# Patient Record
Sex: Female | Born: 1960 | Race: White | Hispanic: No | Marital: Married | Smoking: Former smoker
Health system: Southern US, Community
[De-identification: ages and names within clinical notes are randomized; demographics above are authoritative.]

## PROBLEM LIST (undated history)

## (undated) DIAGNOSIS — F419 Anxiety disorder, unspecified: Secondary | ICD-10-CM

## (undated) DIAGNOSIS — E531 Pyridoxine deficiency: Secondary | ICD-10-CM

## (undated) DIAGNOSIS — F329 Major depressive disorder, single episode, unspecified: Secondary | ICD-10-CM

## (undated) DIAGNOSIS — M1990 Unspecified osteoarthritis, unspecified site: Secondary | ICD-10-CM

## (undated) DIAGNOSIS — E669 Obesity, unspecified: Secondary | ICD-10-CM

## (undated) DIAGNOSIS — A4902 Methicillin resistant Staphylococcus aureus infection, unspecified site: Secondary | ICD-10-CM

## (undated) DIAGNOSIS — I509 Heart failure, unspecified: Secondary | ICD-10-CM

## (undated) DIAGNOSIS — J811 Chronic pulmonary edema: Secondary | ICD-10-CM

## (undated) DIAGNOSIS — M549 Dorsalgia, unspecified: Secondary | ICD-10-CM

## (undated) DIAGNOSIS — F32A Depression, unspecified: Secondary | ICD-10-CM

## (undated) DIAGNOSIS — I10 Essential (primary) hypertension: Secondary | ICD-10-CM

## (undated) DIAGNOSIS — J449 Chronic obstructive pulmonary disease, unspecified: Secondary | ICD-10-CM

## (undated) DIAGNOSIS — K219 Gastro-esophageal reflux disease without esophagitis: Secondary | ICD-10-CM

## (undated) DIAGNOSIS — M47816 Spondylosis without myelopathy or radiculopathy, lumbar region: Secondary | ICD-10-CM

## (undated) DIAGNOSIS — N189 Chronic kidney disease, unspecified: Secondary | ICD-10-CM

## (undated) DIAGNOSIS — Z1621 Resistance to vancomycin: Secondary | ICD-10-CM

## (undated) DIAGNOSIS — E559 Vitamin D deficiency, unspecified: Secondary | ICD-10-CM

## (undated) DIAGNOSIS — A491 Streptococcal infection, unspecified site: Secondary | ICD-10-CM

## (undated) HISTORY — PX: COLON SURGERY: SHX602

## (undated) HISTORY — PX: ABDOMINAL HYSTERECTOMY: SHX81

## (undated) HISTORY — PX: ABDOMINAL SURGERY: SHX537

---

## 2004-04-16 ENCOUNTER — Ambulatory Visit: Payer: Self-pay | Admitting: Family Medicine

## 2004-06-02 ENCOUNTER — Ambulatory Visit: Payer: Self-pay | Admitting: Family Medicine

## 2004-06-23 ENCOUNTER — Ambulatory Visit: Payer: Self-pay

## 2004-08-12 ENCOUNTER — Inpatient Hospital Stay: Payer: Self-pay | Admitting: Internal Medicine

## 2008-10-20 ENCOUNTER — Ambulatory Visit: Payer: Self-pay | Admitting: Internal Medicine

## 2014-04-04 ENCOUNTER — Ambulatory Visit: Payer: Self-pay | Admitting: Family Medicine

## 2016-01-24 ENCOUNTER — Ambulatory Visit: Payer: Medicare Other

## 2016-01-24 ENCOUNTER — Ambulatory Visit
Admission: EM | Admit: 2016-01-24 | Discharge: 2016-01-24 | Disposition: A | Discharge status: Home / Self Care | Payer: Medicare Other | Attending: Family Medicine | Admitting: Family Medicine

## 2016-01-24 ENCOUNTER — Encounter: Payer: Self-pay | Admitting: Emergency Medicine

## 2016-01-24 DIAGNOSIS — Z7901 Long term (current) use of anticoagulants: Secondary | ICD-10-CM | POA: Diagnosis not present

## 2016-01-24 DIAGNOSIS — Z944 Liver transplant status: Secondary | ICD-10-CM | POA: Diagnosis not present

## 2016-01-24 DIAGNOSIS — Z9482 Intestine transplant status: Secondary | ICD-10-CM | POA: Diagnosis not present

## 2016-01-24 DIAGNOSIS — E559 Vitamin D deficiency, unspecified: Secondary | ICD-10-CM | POA: Diagnosis not present

## 2016-01-24 DIAGNOSIS — Z7951 Long term (current) use of inhaled steroids: Secondary | ICD-10-CM | POA: Diagnosis not present

## 2016-01-24 DIAGNOSIS — Z7952 Long term (current) use of systemic steroids: Secondary | ICD-10-CM | POA: Diagnosis not present

## 2016-01-24 DIAGNOSIS — X58XXXA Exposure to other specified factors, initial encounter: Secondary | ICD-10-CM | POA: Insufficient documentation

## 2016-01-24 DIAGNOSIS — K219 Gastro-esophageal reflux disease without esophagitis: Secondary | ICD-10-CM | POA: Diagnosis not present

## 2016-01-24 DIAGNOSIS — S8391XA Sprain of unspecified site of right knee, initial encounter: Secondary | ICD-10-CM | POA: Insufficient documentation

## 2016-01-24 DIAGNOSIS — Z87891 Personal history of nicotine dependence: Secondary | ICD-10-CM | POA: Insufficient documentation

## 2016-01-24 DIAGNOSIS — Z9483 Pancreas transplant status: Secondary | ICD-10-CM | POA: Diagnosis not present

## 2016-01-24 DIAGNOSIS — I10 Essential (primary) hypertension: Secondary | ICD-10-CM | POA: Insufficient documentation

## 2016-01-24 DIAGNOSIS — J449 Chronic obstructive pulmonary disease, unspecified: Secondary | ICD-10-CM | POA: Diagnosis not present

## 2016-01-24 DIAGNOSIS — Z79899 Other long term (current) drug therapy: Secondary | ICD-10-CM | POA: Insufficient documentation

## 2016-01-24 DIAGNOSIS — M25561 Pain in right knee: Secondary | ICD-10-CM | POA: Diagnosis not present

## 2016-01-24 HISTORY — DX: Obesity, unspecified: E66.9

## 2016-01-24 HISTORY — DX: Chronic kidney disease, unspecified: N18.9

## 2016-01-24 HISTORY — DX: Vitamin D deficiency, unspecified: E55.9

## 2016-01-24 HISTORY — DX: Essential (primary) hypertension: I10

## 2016-01-24 HISTORY — DX: Pyridoxine deficiency: E53.1

## 2016-01-24 HISTORY — DX: Dorsalgia, unspecified: M54.9

## 2016-01-24 HISTORY — DX: Resistance to vancomycin: Z16.21

## 2016-01-24 HISTORY — DX: Streptococcal infection, unspecified site: A49.1

## 2016-01-24 HISTORY — DX: Gastro-esophageal reflux disease without esophagitis: K21.9

## 2016-01-24 HISTORY — DX: Methicillin resistant Staphylococcus aureus infection, unspecified site: A49.02

## 2016-01-24 HISTORY — DX: Chronic pulmonary edema: J81.1

## 2016-01-24 HISTORY — DX: Spondylosis without myelopathy or radiculopathy, lumbar region: M47.816

## 2016-01-24 HISTORY — DX: Unspecified osteoarthritis, unspecified site: M19.90

## 2016-01-24 HISTORY — DX: Heart failure, unspecified: I50.9

## 2016-01-24 HISTORY — DX: Chronic obstructive pulmonary disease, unspecified: J44.9

## 2016-01-24 HISTORY — DX: Depression, unspecified: F32.A

## 2016-01-24 HISTORY — DX: Anxiety disorder, unspecified: F41.9

## 2016-01-24 HISTORY — DX: Major depressive disorder, single episode, unspecified: F32.9

## 2016-01-24 NOTE — ED Triage Notes (Signed)
Patient c/o pain in her right knee after injuring it at home today.

## 2016-01-24 NOTE — ED Provider Notes (Signed)
[facility]    CSN: [id number] Arrival date & time: 01/24/16  1652     History   Chief Complaint Chief Complaint  Patient presents with  . Knee Pain    right    HPI Kaitlyn Collier is a 55 y.o. female.   Patient's here because of right knee pain. She states earlier today she stepped off a step up position and developed pain in her right knee. She thinks she twisted her right knee. She was worried and came in to be evaluated and treated and should be noted patient is a very unusual medical patient. 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 She is has chronic back pain and has had to have a nerve ablation done as well. She is unable take anti-inflammatories and she hasn't limited her mental Tylenol due to her liver transplant. She's also multiple medications and limitations far as what medicine she can take when she can't. Past family medical history is nothing significant or pertinent to today's visit. Size having the multiorgan transplant she's had a hysterectomy. She is a former smoker. She has long list of medicines which she cannot take.   The history is provided by the patient and the spouse. No language interpreter was used.  Knee Pain  Location:  Knee Injury: yes   Knee location:  R knee Pain details:    Quality:  Aching, pressure and shooting   Severity:  Moderate   Onset quality:  Sudden   Timing:  Constant   Progression:  Worsening Chronicity:  New Dislocation: no   Foreign body present:  No foreign bodies Relieved by:  Nothing Associated symptoms: back pain     Past Medical History:  Diagnosis Date  . Anxiety   . Back pain   . CHF (congestive heart failure) ([place])   . CKD (chronic kidney disease)   . COPD (chronic obstructive pulmonary disease) ([place])   . Depression   . DJD (degenerative joint disease), lumbar     . GERD (gastroesophageal reflux disease)   . Hypertension   . MRSA (methicillin resistant Staphylococcus aureus)   . Obesity   . Osteoarthritis   . Pulmonary edema   . Vitamin B6 deficiency   . Vitamin D deficiency   . VRE (vancomycin-resistant Enterococci)     There are no active problems to display for this patient.   Past Surgical History:  Procedure Laterality Date  . ABDOMINAL HYSTERECTOMY    . ABDOMINAL SURGERY    . COLON SURGERY      OB History    No data available       Home Medications    Prior to Admission medications   Medication Sig Start Date End Date Taking? Authorizing Provider  acetaminophen (TYLENOL) 325 MG tablet Take 650 mg by mouth 2 (two) times daily.   Yes Historical Provider, MD  albuterol (PROVENTIL HFA;VENTOLIN HFA) 108 (90 Base) MCG/ACT inhaler Inhale 2 puffs into the lungs every 6 (six) hours as needed for wheezing or shortness of breath.   Yes Historical Provider, MD  apixaban (ELIQUIS) 2.5 MG TABS tablet Take 2.5 mg by mouth 2 (two) times daily.   Yes Historical Provider, MD  budesonide (PULMICORT) 0.5 MG/2ML nebulizer solution Take 0.5 mg by nebulization 2 (two) times daily.   Yes Historical Provider, MD  buPROPion [facility] SR)  150 MG 12 hr tablet Take 300 mg by mouth daily.   Yes Historical Provider, MD  calcitRIOL (ROCALTROL) 0.25 MCG capsule Take 0.25 mcg by mouth daily.   Yes Historical Provider, MD  cetirizine (ZYRTEC) 10 MG tablet Take 10 mg by mouth daily.   Yes Historical Provider, MD  citalopram (CELEXA) 40 MG tablet Take 40 mg by mouth daily.   Yes Historical Provider, MD  cycloSPORINE modified (NEORAL) 25 MG capsule Take 75 mg by mouth 2 (two) times daily.   Yes Historical Provider, MD  diazepam (VALIUM) 5 MG tablet Take 5 mg by mouth at bedtime as needed for anxiety.   Yes Historical Provider, MD  fluticasone (FLONASE) 50 MCG/ACT nasal spray Place 1 spray into both nostrils daily.   Yes Historical Provider, MD  fluticasone  furoate-vilanterol (BREO ELLIPTA) 100-25 MCG/INH AEPB Inhale 1 puff into the lungs daily.   Yes Historical Provider, MD  HYDROmorphone (DILAUDID) 2 MG tablet Take 2 mg by mouth every 6 (six) hours as needed for severe pain.   Yes Historical Provider, MD  ipratropium-albuterol (DUONEB) 0.5-2.5 (3) MG/3ML SOLN Take 3 mLs by nebulization 3 (three) times daily.   Yes Historical Provider, MD  losartan (COZAAR) 50 MG tablet Take 50 mg by mouth daily.   Yes Historical Provider, MD  Multiple Vitamin (MULTIVITAMIN WITH MINERALS) TABS tablet Take 1 tablet by mouth daily.   Yes Historical Provider, MD  mycophenolate (CELLCEPT) 250 MG capsule Take 500 mg by mouth 2 (two) times daily.   Yes Historical Provider, MD  omeprazole (PRILOSEC OTC) 20 MG tablet Take 20 mg by mouth daily.   Yes Historical Provider, MD  predniSONE (DELTASONE) 5 MG tablet Take 5 mg by mouth daily with breakfast.   Yes Historical Provider, MD  SUMAtriptan (IMITREX) 25 MG tablet Take 50 mg by mouth every 2 (two) hours as needed for migraine. May repeat in 2 hours if headache persists or recurs.   Yes Historical Provider, MD  topiramate (TOPAMAX) 100 MG tablet Take 200 mg by mouth daily.   Yes Historical Provider, MD  verapamil (CALAN) 80 MG tablet Take 80 mg by mouth 2 (two) times daily.   Yes Historical Provider, MD    Family History History reviewed. No pertinent family history.  Social History Social History  Substance Use Topics  . Smoking status: Former Research scientist (life sciences)  . Smokeless tobacco: Never Used  . Alcohol use No     Allergies   Review of patient's allergies indicates not on file.   Review of Systems Review of Systems  Musculoskeletal: Positive for arthralgias, back pain, gait problem and myalgias.  All other systems reviewed and are negative.    Physical Exam Triage Vital Signs ED Triage Vitals  Enc Vitals Group     BP 01/24/16 1740 (!) 164/87     Pulse Rate 01/24/16 1740 80     Resp 01/24/16 1740 16     Temp  01/24/16 1740 98 F (36.7 C)     Temp Source 01/24/16 1740 Tympanic     SpO2 01/24/16 1740 98 %     Weight 01/24/16 1739 184 lb (83.5 kg)     Height 01/24/16 1739 5' 4.5" (1.638 m)     Head Circumference --      Peak Flow --      Pain Score 01/24/16 1742 9     Pain Loc --      Pain Edu? --      Excl. in [place]? --  No data found.   Updated Vital Signs BP (!) 164/87 (BP Location: Left Arm)   Pulse 80   Temp 98 F (36.7 C) (Tympanic)   Resp 16   Ht 5' 4.5" (1.638 m)   Wt 184 lb (83.5 kg)   SpO2 98%   BMI 31.10 kg/m   Visual Acuity Right Eye Distance:   Left Eye Distance:   Bilateral Distance:    Right Eye Near:   Left Eye Near:    Bilateral Near:     Physical Exam  Constitutional: She appears well-developed and well-nourished. No distress.  HENT:  Head: Normocephalic and atraumatic.  Eyes: Pupils are equal, round, and reactive to light.  Neck: Normal range of motion.  Pulmonary/Chest: Effort normal.  Musculoskeletal: She exhibits edema and tenderness. She exhibits no deformity.       Right knee: She exhibits swelling and bony tenderness. She exhibits no deformity, normal alignment, no LCL laxity, normal patellar mobility and no MCL laxity. Tenderness found.  Neurological: She is alert.  Skin: Skin is warm. She is not diaphoretic. No erythema.  Psychiatric: She has a normal mood and affect.  Vitals reviewed.    UC Treatments / Results  Labs (all labs ordered are listed, but only abnormal results are displayed) Labs Reviewed - No data to display  EKG  EKG Interpretation None       Radiology Dg Knee Complete 4 Views Right  Result Date: 01/24/2016 CLINICAL DATA:  Right knee anterior and lateral pain. EXAM: RIGHT KNEE - COMPLETE 4+ VIEW COMPARISON:  None. FINDINGS: No evidence of fracture, dislocation, or joint effusion. No evidence of focal bone abnormality. Mild osteoarthritic changes of the patellofemoral and lateral compartment and moderate  osteoarthritic changes of the medial compartment of the knee joint are seen. Soft tissues are unremarkable. IMPRESSION: No acute fracture or dislocation identified about the right Knee. Osteoarthritic changes, predominantly in the medial compartment of the right knee joint. Electronically Signed   By: Fidela Salisbury M.D.   On: 01/24/2016 18:50    Procedures Procedures (including critical care time)  Medications Ordered in UC Medications - No data to display   Initial Impression / Assessment and Plan / UC Course  I have reviewed the triage vital signs and the nursing notes.  Pertinent labs & imaging results that were available during my care of the patient were reviewed by me and considered in my medical decision making (see chart for details).  Clinical Course   Patient was informed that she has osteoarthritic changes in the right knee at this point time because she has limitations as far as pain medications and anti-inflammatories and also acetaminophen will allow her take her regular pain medication she has a home with follow-up with orthopedic as needed.  Final Clinical Impressions(s) / UC Diagnoses   Final diagnoses:  Acute pain of right knee  Sprain of right knee, unspecified ligament, initial encounter    New Prescriptions Discharge Medication List as of 01/24/2016  7:17 PM       Frederich Cha, MD 01/24/16 1955

## 2016-01-26 ENCOUNTER — Telehealth: Payer: Self-pay

## 2016-01-26 NOTE — Telephone Encounter (Signed)
Courtesy call back completed today after patients visit at [facility]. Patient improved and will follow up with their PCP if symptoms continue or worsen.   

## 2017-11-13 ENCOUNTER — Emergency Department: Payer: Medicare Other

## 2017-11-13 ENCOUNTER — Other Ambulatory Visit: Payer: Self-pay

## 2017-11-13 ENCOUNTER — Emergency Department
Admission: EM | Admit: 2017-11-13 | Discharge: 2017-11-13 | Disposition: A | Discharge status: Home / Self Care | Payer: Medicare Other | Attending: Emergency Medicine | Admitting: Emergency Medicine

## 2017-11-13 DIAGNOSIS — Z7901 Long term (current) use of anticoagulants: Secondary | ICD-10-CM | POA: Diagnosis not present

## 2017-11-13 DIAGNOSIS — Y999 Unspecified external cause status: Secondary | ICD-10-CM | POA: Insufficient documentation

## 2017-11-13 DIAGNOSIS — Z79899 Other long term (current) drug therapy: Secondary | ICD-10-CM | POA: Diagnosis not present

## 2017-11-13 DIAGNOSIS — Y92009 Unspecified place in unspecified non-institutional (private) residence as the place of occurrence of the external cause: Secondary | ICD-10-CM | POA: Diagnosis not present

## 2017-11-13 DIAGNOSIS — S0990XA Unspecified injury of head, initial encounter: Secondary | ICD-10-CM | POA: Diagnosis not present

## 2017-11-13 DIAGNOSIS — Y9301 Activity, walking, marching and hiking: Secondary | ICD-10-CM | POA: Insufficient documentation

## 2017-11-13 DIAGNOSIS — Z87891 Personal history of nicotine dependence: Secondary | ICD-10-CM | POA: Insufficient documentation

## 2017-11-13 DIAGNOSIS — I509 Heart failure, unspecified: Secondary | ICD-10-CM | POA: Insufficient documentation

## 2017-11-13 DIAGNOSIS — J449 Chronic obstructive pulmonary disease, unspecified: Secondary | ICD-10-CM | POA: Insufficient documentation

## 2017-11-13 DIAGNOSIS — W108XXA Fall (on) (from) other stairs and steps, initial encounter: Secondary | ICD-10-CM | POA: Insufficient documentation

## 2017-11-13 DIAGNOSIS — S52501A Unspecified fracture of the lower end of right radius, initial encounter for closed fracture: Secondary | ICD-10-CM | POA: Diagnosis not present

## 2017-11-13 DIAGNOSIS — I130 Hypertensive heart and chronic kidney disease with heart failure and stage 1 through stage 4 chronic kidney disease, or unspecified chronic kidney disease: Secondary | ICD-10-CM | POA: Insufficient documentation

## 2017-11-13 DIAGNOSIS — N189 Chronic kidney disease, unspecified: Secondary | ICD-10-CM | POA: Diagnosis not present

## 2017-11-13 DIAGNOSIS — S59911A Unspecified injury of right forearm, initial encounter: Secondary | ICD-10-CM | POA: Diagnosis present

## 2017-11-13 DIAGNOSIS — W19XXXA Unspecified fall, initial encounter: Secondary | ICD-10-CM

## 2017-11-13 LAB — CBC WITH DIFFERENTIAL/PLATELET
Basophils Absolute: 0 10*3/uL (ref 0–0.1)
Basophils Relative: 1 %
EOS PCT: 1 %
Eosinophils Absolute: 0 10*3/uL (ref 0–0.7)
HCT: 25 % — ABNORMAL LOW (ref 35.0–47.0)
HEMOGLOBIN: 8.1 g/dL — AB (ref 12.0–16.0)
LYMPHS ABS: 0.3 10*3/uL — AB (ref 1.0–3.6)
Lymphocytes Relative: 23 %
MCH: 29.5 pg (ref 26.0–34.0)
MCHC: 32.2 g/dL (ref 32.0–36.0)
MCV: 91.4 fL (ref 80.0–100.0)
MONOS PCT: 8 %
Monocytes Absolute: 0.1 10*3/uL — ABNORMAL LOW (ref 0.2–0.9)
NEUTROS PCT: 67 %
Neutro Abs: 0.9 10*3/uL — ABNORMAL LOW (ref 1.4–6.5)
Platelets: 128 10*3/uL — ABNORMAL LOW (ref 150–440)
RBC: 2.73 MIL/uL — AB (ref 3.80–5.20)
RDW: 16.3 % — ABNORMAL HIGH (ref 11.5–14.5)
WBC: 1.3 10*3/uL — AB (ref 3.6–11.0)

## 2017-11-13 LAB — COMPREHENSIVE METABOLIC PANEL
ALK PHOS: 137 U/L — AB (ref 38–126)
ALT: 15 U/L (ref 0–44)
AST: 29 U/L (ref 15–41)
Albumin: 3.3 g/dL — ABNORMAL LOW (ref 3.5–5.0)
Anion gap: 8 (ref 5–15)
BUN: 42 mg/dL — ABNORMAL HIGH (ref 6–20)
CALCIUM: 8.3 mg/dL — AB (ref 8.9–10.3)
CO2: 21 mmol/L — ABNORMAL LOW (ref 22–32)
CREATININE: 4.8 mg/dL — AB (ref 0.44–1.00)
Chloride: 114 mmol/L — ABNORMAL HIGH (ref 98–111)
GFR calc non Af Amer: 9 mL/min — ABNORMAL LOW (ref >60)
GFR, EST AFRICAN AMERICAN: 11 mL/min — AB (ref >60)
Glucose, Bld: 94 mg/dL (ref 70–99)
Potassium: 4.1 mmol/L (ref 3.5–5.1)
Sodium: 143 mmol/L (ref 135–145)
TOTAL PROTEIN: 6 g/dL — AB (ref 6.5–8.1)
Total Bilirubin: 1 mg/dL (ref 0.3–1.2)

## 2017-11-13 MED ORDER — ONDANSETRON 4 MG PO TBDP
4.0000 mg | ORAL_TABLET | Freq: Once | ORAL | Status: AC
  Administered 2017-11-13: 4 mg via ORAL
  Filled 2017-11-13: qty 1

## 2017-11-13 MED ORDER — BACITRACIN ZINC 500 UNIT/GM EX OINT
TOPICAL_OINTMENT | Freq: Once | CUTANEOUS | Status: AC
  Administered 2017-11-13: 1 via TOPICAL

## 2017-11-13 MED ORDER — HYDROMORPHONE HCL 1 MG/ML IJ SOLN
0.5000 mg | INTRAMUSCULAR | Status: AC
  Administered 2017-11-13: 0.5 mg via INTRAMUSCULAR

## 2017-11-13 MED ORDER — HYDROMORPHONE HCL 1 MG/ML IJ SOLN
INTRAMUSCULAR | Status: AC
  Administered 2017-11-13: 0.5 mg via INTRAMUSCULAR
  Filled 2017-11-13: qty 1

## 2017-11-13 MED ORDER — OXYCODONE-ACETAMINOPHEN 5-325 MG PO TABS
1.0000 | ORAL_TABLET | Freq: Once | ORAL | Status: AC
  Administered 2017-11-13: 1 via ORAL
  Filled 2017-11-13: qty 1

## 2017-11-13 MED ORDER — BACITRACIN ZINC 500 UNIT/GM EX OINT
TOPICAL_OINTMENT | CUTANEOUS | Status: AC
  Filled 2017-11-13: qty 0.9

## 2017-11-13 NOTE — ED Notes (Signed)
Dr. Cinda Quest is aware of WBC of 1.3.

## 2017-11-13 NOTE — ED Notes (Signed)
Xray called this RN stating that pt would not cooperate for xrays and xray requested pain medicine. Orders received from Dr. Jacqualine Code.

## 2017-11-13 NOTE — ED Notes (Signed)
Abrasion noted to R elbow, no deformity noted by this RN as previously expressed by EMS. Bleeding under control at this time.

## 2017-11-13 NOTE — ED Notes (Signed)
Wound care r arm with sterile dressing applied  Wrapped with kling patient tolerated procedure well

## 2017-11-13 NOTE — ED Provider Notes (Addendum)
Went to evaluate patient, she is currently having imaging studies performed including x-rays and CT the head and is not presently in the room.   Delman Kitten, MD 11/13/17 1014    Delman Kitten, MD 11/13/17 1017

## 2017-11-13 NOTE — ED Provider Notes (Addendum)
[facility] Emergency Department Provider Note   ____________________________________________   First MD Initiated Contact with Patient 11/13/17 1111     (approximate)  I have reviewed the triage vital signs and the nursing notes.   HISTORY  Chief Complaint Fall and Arm Injury    HPI Kaitlyn Collier is a 57 y.o. female patient reports she was letting the dogs out of the house stepped out further than usual and overbalanced and fell.  She does not remember exactly what happens but she is pretty sure that is what happened.  When I suggest that she may have passed out first and then fall and she says "I do not pass out".  Patient complains of pain in her wrist.  There is deformity there.  There is some bruising on the right forearm some small abrasions around the elbow bruising on the right part of the forehead and bruising on the left palm.  There is no pain in the left hand she is using it well.  Patient has a history of transplant liver small bowel is taking multiple antirejection drugs is supposed to take them at 9 AM. Past Medical History:  Diagnosis Date  . Anxiety   . Back pain   . CHF (congestive heart failure) ([place])   . CKD (chronic kidney disease)   . COPD (chronic obstructive pulmonary disease) ([place])   . Depression   . DJD (degenerative joint disease), lumbar   . GERD (gastroesophageal reflux disease)   . Hypertension   . MRSA (methicillin resistant Staphylococcus aureus)   . Obesity   . Osteoarthritis   . Pulmonary edema   . Vitamin B6 deficiency   . Vitamin D deficiency   . VRE (vancomycin-resistant Enterococci)     There are no active problems to display for this patient.   Past Surgical History:  Procedure Laterality Date  . ABDOMINAL HYSTERECTOMY    . ABDOMINAL SURGERY    . COLON SURGERY      Prior to Admission medications   Medication Sig Start Date End Date Taking? Authorizing Provider  acetaminophen (TYLENOL) 325 MG tablet  Take 650 mg by mouth 2 (two) times daily.    [provider]  albuterol (PROVENTIL HFA;VENTOLIN HFA) 108 (90 Base) MCG/ACT inhaler Inhale 2 puffs into the lungs every 6 (six) hours as needed for wheezing or shortness of breath.    [provider]  apixaban (ELIQUIS) 2.5 MG TABS tablet Take 2.5 mg by mouth 2 (two) times daily.    [provider]  budesonide (PULMICORT) 0.5 MG/2ML nebulizer solution Take 0.5 mg by nebulization 2 (two) times daily.    [provider]  buPROPion (WELLBUTRIN SR) 150 MG 12 hr tablet Take 300 mg by mouth daily.    [provider]  calcitRIOL (ROCALTROL) 0.25 MCG capsule Take 0.25 mcg by mouth daily.    [provider]  cetirizine (ZYRTEC) 10 MG tablet Take 10 mg by mouth daily.    [provider]  citalopram (CELEXA) 40 MG tablet Take 40 mg by mouth daily.    [provider]  cycloSPORINE modified (NEORAL) 25 MG capsule Take 75 mg by mouth 2 (two) times daily.    [provider]  diazepam (VALIUM) 5 MG tablet Take 5 mg by mouth at bedtime as needed for anxiety.    [provider]  fluticasone (FLONASE) 50 MCG/ACT nasal spray Place 1 spray into both nostrils daily.    [provider]  fluticasone  furoate-vilanterol (BREO ELLIPTA) 100-25 MCG/INH AEPB Inhale 1 puff into the lungs daily.    [provider]  HYDROmorphone (DILAUDID) 2 MG tablet Take 2 mg by mouth every 6 (six) hours as needed for severe pain.    [provider]  ipratropium-albuterol (DUONEB) 0.5-2.5 (3) MG/3ML SOLN Take 3 mLs by nebulization 3 (three) times daily.    [provider]  losartan (COZAAR) 50 MG tablet Take 50 mg by mouth daily.    [provider]  Multiple Vitamin (MULTIVITAMIN WITH MINERALS) TABS tablet Take 1 tablet by mouth daily.    [provider]  mycophenolate (CELLCEPT) 250 MG capsule Take 500 mg by mouth 2 (two) times daily.    [provider]  omeprazole (PRILOSEC OTC) 20 MG tablet Take 20 mg by mouth daily.    [provider]  predniSONE (DELTASONE) 5 MG tablet Take 5 mg by mouth daily with breakfast.    [provider]  SUMAtriptan (IMITREX) 25 MG tablet Take 50 mg by mouth every 2 (two) hours as needed for migraine. May repeat in 2 hours if headache persists or recurs.    [provider]  topiramate (TOPAMAX) 100 MG tablet Take 200 mg by mouth daily.    [provider]  verapamil (CALAN) 80 MG tablet Take 80 mg by mouth 2 (two) times daily.    [provider]    Allergies Codeine; Flagyl [metronidazole]; Phenergan [promethazine hcl]; Sulfa antibiotics; and Zosyn [piperacillin sod-tazobactam so]  History reviewed. No pertinent family history.  Social History Social History   Tobacco Use  . Smoking status: Former Research scientist (life sciences)  . Smokeless tobacco: Never Used  Substance Use Topics  . Alcohol use: No  . Drug use: No    Review of Systems  Constitutional: No fever/chills Eyes: No visual changes. ENT: No sore throat. Cardiovascular: Denies chest pain. Respiratory: Denies shortness of breath. Gastrointestinal: No abdominal pain.  No nausea, no vomiting.  No diarrhea.  No constipation. Genitourinary: Negative for dysuria. Musculoskeletal: Negative for back pain. Skin: Negative for rash. Neurological: Negative for headaches, focal weakness    ____________________________________________   PHYSICAL EXAM:  VITAL SIGNS: ED Triage Vitals  Enc Vitals Group     BP 11/13/17 0956 (!) 173/125     Pulse Rate 11/13/17 0954 70     Resp 11/13/17 0954 20     Temp 11/13/17 0954 98.7 F (37.1 C)     Temp Source 11/13/17 0954 Oral     SpO2 11/13/17 0954 93 %     Weight 11/13/17 0950 185 lb (83.9 kg)     Height 11/13/17 0950 5\' 4"  (1.626 m)     Head Circumference --      Peak Flow --      Pain Score 11/13/17 0950 10     Pain Loc --      Pain Edu? --      Excl. in [place]?  --     Constitutional: Alert and oriented. Well appearing and in no acute distress. Eyes: Conjunctivae are normal. PER. EOMI. Head: Atraumatic except for large bruise on the right side of the forehead. Nose: No congestion/rhinnorhea. Mouth/Throat: Mucous membranes are moist.  Oropharynx non-erythematous. Neck: No stridor.  No cervical spine tenderness to palpation. Cardiovascular: Normal rate, regular rhythm. Grossly normal heart sounds.  Good peripheral circulation. Respiratory: Normal respiratory effort.  No retractions. Lungs CTAB. Gastrointestinal: Soft and nontender. No distention. No abdominal bruits. No CVA tenderness. Musculoskeletal: No lower extremity tenderness.  Patient complains of swelling in the right leg.  When I measure both legs.  The right leg is actually smaller round than the left.  Both have slight trace edema. Neurologic:  Normal speech and language. No gross focal neurologic deficits are appreciated. Skin:  Skin is warm, dry and intact. No rash noted. Psychiatric: Mood and affect are normal. Speech and behavior are normal.  ____________________________________________   LABS (all labs ordered are listed, but only abnormal results are displayed)  Labs Reviewed  CBC WITH DIFFERENTIAL/PLATELET - Abnormal; Notable for the following components:      Result Value   WBC 1.3 (*)    RBC 2.73 (*)    Hemoglobin 8.1 (*)    HCT 25.0 (*)    RDW 16.3 (*)    Platelets 128 (*)    Neutro Abs 0.9 (*)    Lymphs Abs 0.3 (*)    Monocytes Absolute 0.1 (*)    All other components within normal limits  COMPREHENSIVE METABOLIC PANEL - Abnormal; Notable for the following components:   Chloride 114 (*)    CO2 21 (*)    BUN 42 (*)    Creatinine, Ser 4.80 (*)    Calcium 8.3 (*)    Total Protein 6.0 (*)    Albumin 3.3 (*)    Alkaline Phosphatase 137 (*)    GFR calc non Af Amer 9 (*)    GFR calc Af Amer 11 (*)    All other components within normal limits    ____________________________________________  EKG  EKG read interpreted by me shows A. fib at rate of 59 left axis no acute ST-T wave changes patient does have a history of A. fib ____________________________________________  RADIOLOGY  ED MD interpretation: X-rays read by radiology reviewed by me show an impacted fracture of the distal radius and ulna.  Head CT is read as negative.  Chest x-ray read by radiology reviewed by me as no acute disease  Official radiology report(s): Dg Chest 2 View  Result Date: 11/13/2017 CLINICAL DATA:  Fall downstairs with chest pain, initial encounter EXAM: CHEST - 2 VIEW COMPARISON:  04/04/2014 FINDINGS: Cardiac shadow is mildly enlarged but stable. Aortic calcifications are noted. Postsurgical changes are again seen in the right mid lung. No acute bony abnormality is noted. No focal infiltrate is seen. The lungs are hyperinflated. IMPRESSION: No acute abnormality noted. Electronically Signed   By: Inez Catalina M.D.   On: 11/13/2017 12:33   Dg Elbow Complete Right  Result Date: 11/13/2017 CLINICAL DATA:  Recent fall with elbow pain, initial encounter EXAM: RIGHT ELBOW - COMPLETE 3+ VIEW COMPARISON:  None. FINDINGS: There is no evidence of fracture, dislocation, or joint effusion. There is no evidence of arthropathy or other focal bone abnormality. Soft tissues are unremarkable. IMPRESSION: No acute abnormality noted. Electronically Signed   By: Inez Catalina M.D.   On: 11/13/2017 10:46   Ct Head Wo Contrast  Result Date: 11/13/2017 CLINICAL DATA:  Recent fall EXAM: CT HEAD WITHOUT CONTRAST TECHNIQUE: Contiguous axial images were obtained from the base of the skull through the vertex without intravenous contrast. COMPARISON:  None. FINDINGS: Brain: Mild atrophic changes are identified. No findings to suggest acute hemorrhage, acute infarction or space-occupying mass lesion are noted. Vascular: No hyperdense vessel or unexpected calcification. Skull: Normal.  Negative for fracture or focal lesion. Sinuses/Orbits: No acute finding. Other: Scalp hematoma is noted in the right frontal region consistent with the recent injury. IMPRESSION: Atrophic changes and scalp hematoma on  the right. No other acute abnormality is noted. Electronically Signed   By: Inez Catalina M.D.   On: 11/13/2017 10:36   Dg Hand Complete Right  Result Date: 11/13/2017 CLINICAL DATA:  Recent fall with hand pain, initial encounter EXAM: RIGHT HAND - COMPLETE 3+ VIEW COMPARISON:  None. FINDINGS: There are mildly impacted fractures of the distal radial and ulnar metastases. No significant displacement is noted aside from the impaction. No other fractures are seen. IMPRESSION: Distal radial and ulnar fractures with mild impaction. Electronically Signed   By: Inez Catalina M.D.   On: 11/13/2017 10:46    ____________________________________________   PROCEDURES  Procedure(s) performed:   Procedures  Critical Care performed:   ____________________________________________   INITIAL IMPRESSION / ASSESSMENT AND PLAN / ED COURSE  I have contacted orthopedics he is reviewing the films.  We will plan on evaluating the patient for possible syncope.  Stressing the abrasions and putting the patient in a splint wants orthopedics confirms that is what they want.   Orthopedics calls back we will splint the patient he will follow him up in clinic we use a long-arm splint to try to keep the splint off of the abrasions instead of the sugar tong as I was planning on.   ____________________________________________   FINAL CLINICAL IMPRESSION(S) / ED DIAGNOSES  Final diagnoses:  Fall, initial encounter  Closed fracture of distal end of right radius, unspecified fracture morphology, initial encounter     ED Discharge Orders    None       Note:  This document was prepared using Dragon voice recognition software and may include unintentional dictation errors.    Nena Polio,  MD 11/13/17 1251    Nena Polio, MD 11/13/17 [phone]

## 2017-11-13 NOTE — ED Notes (Signed)
X-ray at bedside

## 2017-11-13 NOTE — ED Notes (Signed)
Splint applied by ED tech Wilfred Lacy.

## 2017-11-13 NOTE — ED Triage Notes (Addendum)
Pt arrives [facility] for a fall. R arm arrives splinted. Fell down 3 stairs at home, landed on concrete. Deformity to R elbow, hematoma to R head. Does take blood thinners (eliquis). VSS. Hx COPD. No LOC. No dizziness. Just tripped.    Bruising noted all over body.

## 2017-11-13 NOTE — ED Notes (Signed)
Pt in CT at this time.

## 2017-11-13 NOTE — ED Notes (Signed)
Patient c/o nausea prior to going to x-ray. Patient requested and was given crackers.

## 2017-11-13 NOTE — Discharge Instructions (Addendum)
Keep the arm elevated as much as you can.  You can put ice on for 20 minutes every hour or so if you can tolerate it.  Be careful to keep a towel between your arm and the ice.  Wear the splint use a sling call orthopedics to arrange follow-up this coming week.  Please return for increasing pain numbness or blueness of paleness of the hand or any other new symptoms.  Use your pain medicine that you have at home.  Have the abrasions checked either at your doctor's office or here in 2 or 3 days unless you have already seen the orthopedic surgeon.

## 2017-11-13 NOTE — ED Notes (Signed)
Pt refusing to answer questions about medical hx, keeps repeating "my husband knows everything." will not answer allergies or what blood thinner pt is taking.   No sticks on L arm d/t "being prepared for dialysis."

## 2017-12-13 MED ORDER — MELATONIN 3 MG PO TABS
3.00 | ORAL_TABLET | ORAL | Status: DC
Start: 2017-12-13 — End: 2017-12-13

## 2017-12-13 MED ORDER — ACETAMINOPHEN 325 MG PO TABS
650.00 | ORAL_TABLET | ORAL | Status: DC
Start: ? — End: 2017-12-13

## 2017-12-13 MED ORDER — BUPROPION HCL ER (SR) 150 MG PO TB12
150.00 | ORAL_TABLET | ORAL | Status: DC
Start: 2017-12-14 — End: 2017-12-13

## 2017-12-13 MED ORDER — HEPARIN SODIUM (PORCINE) 1000 UNIT/ML IJ SOLN
INTRAMUSCULAR | Status: DC
Start: ? — End: 2017-12-13

## 2017-12-13 MED ORDER — PANTOPRAZOLE SODIUM 40 MG PO TBEC
40.00 | DELAYED_RELEASE_TABLET | ORAL | Status: DC
Start: 2017-12-14 — End: 2017-12-13

## 2017-12-13 MED ORDER — CITALOPRAM HYDROBROMIDE 20 MG PO TABS
40.00 | ORAL_TABLET | ORAL | Status: DC
Start: 2017-12-14 — End: 2017-12-13

## 2017-12-13 MED ORDER — GENERIC EXTERNAL MEDICATION
1.00 | Status: DC
Start: ? — End: 2017-12-13

## 2017-12-13 MED ORDER — PREDNISONE 5 MG PO TABS
5.00 | ORAL_TABLET | ORAL | Status: DC
Start: 2017-12-14 — End: 2017-12-13

## 2017-12-13 MED ORDER — TOPIRAMATE 100 MG PO TABS
100.00 | ORAL_TABLET | ORAL | Status: DC
Start: 2017-12-13 — End: 2017-12-13

## 2017-12-13 MED ORDER — APIXABAN 2.5 MG PO TABS
2.50 | ORAL_TABLET | ORAL | Status: DC
Start: 2017-12-13 — End: 2017-12-13

## 2017-12-13 MED ORDER — GENERIC EXTERNAL MEDICATION
12.50 | Status: DC
Start: 2017-12-13 — End: 2017-12-13

## 2017-12-13 MED ORDER — MYCOPHENOLATE MOFETIL 250 MG PO CAPS
500.00 | ORAL_CAPSULE | ORAL | Status: DC
Start: 2017-12-13 — End: 2017-12-13

## 2017-12-13 MED ORDER — CYCLOSPORINE MODIFIED (NEORAL) 25 MG PO CAPS
50.00 | ORAL_CAPSULE | ORAL | Status: DC
Start: 2017-12-13 — End: 2017-12-13

## 2017-12-13 MED ORDER — EPOETIN ALFA 4000 UNIT/ML IJ SOLN
4000.00 | INTRAMUSCULAR | Status: DC
Start: ? — End: 2017-12-13

## 2017-12-13 MED ORDER — VERAPAMIL HCL 80 MG PO TABS
80.00 | ORAL_TABLET | ORAL | Status: DC
Start: 2017-12-13 — End: 2017-12-13

## 2018-09-09 ENCOUNTER — Emergency Department
Admission: EM | Admit: 2018-09-09 | Discharge: 2018-09-09 | Disposition: A | Discharge status: Home / Self Care | Payer: Medicare Other | Attending: Emergency Medicine | Admitting: Emergency Medicine

## 2018-09-09 ENCOUNTER — Other Ambulatory Visit: Payer: Self-pay

## 2018-09-09 ENCOUNTER — Encounter: Payer: Self-pay | Admitting: Intensive Care

## 2018-09-09 DIAGNOSIS — J449 Chronic obstructive pulmonary disease, unspecified: Secondary | ICD-10-CM | POA: Diagnosis not present

## 2018-09-09 DIAGNOSIS — T82898A Other specified complication of vascular prosthetic devices, implants and grafts, initial encounter: Secondary | ICD-10-CM | POA: Diagnosis not present

## 2018-09-09 DIAGNOSIS — I130 Hypertensive heart and chronic kidney disease with heart failure and stage 1 through stage 4 chronic kidney disease, or unspecified chronic kidney disease: Secondary | ICD-10-CM | POA: Insufficient documentation

## 2018-09-09 DIAGNOSIS — Z7901 Long term (current) use of anticoagulants: Secondary | ICD-10-CM | POA: Insufficient documentation

## 2018-09-09 DIAGNOSIS — R58 Hemorrhage, not elsewhere classified: Secondary | ICD-10-CM

## 2018-09-09 DIAGNOSIS — Z87891 Personal history of nicotine dependence: Secondary | ICD-10-CM | POA: Insufficient documentation

## 2018-09-09 DIAGNOSIS — I509 Heart failure, unspecified: Secondary | ICD-10-CM | POA: Insufficient documentation

## 2018-09-09 DIAGNOSIS — N189 Chronic kidney disease, unspecified: Secondary | ICD-10-CM | POA: Diagnosis not present

## 2018-09-09 DIAGNOSIS — Z992 Dependence on renal dialysis: Secondary | ICD-10-CM | POA: Insufficient documentation

## 2018-09-09 DIAGNOSIS — Z79899 Other long term (current) drug therapy: Secondary | ICD-10-CM | POA: Diagnosis not present

## 2018-09-09 DIAGNOSIS — Y828 Other medical devices associated with adverse incidents: Secondary | ICD-10-CM | POA: Diagnosis not present

## 2018-09-09 LAB — CBC
HCT: 28.4 % — ABNORMAL LOW (ref 36.0–46.0)
Hemoglobin: 8.8 g/dL — ABNORMAL LOW (ref 12.0–15.0)
MCH: 30.7 pg (ref 26.0–34.0)
MCHC: 31 g/dL (ref 30.0–36.0)
MCV: 99 fL (ref 80.0–100.0)
Platelets: 91 10*3/uL — ABNORMAL LOW (ref 150–400)
RBC: 2.87 MIL/uL — ABNORMAL LOW (ref 3.87–5.11)
RDW: 15.3 % (ref 11.5–15.5)
WBC: 1.5 10*3/uL — ABNORMAL LOW (ref 4.0–10.5)
nRBC: 0 % (ref 0.0–0.2)

## 2018-09-09 LAB — PROTIME-INR
INR: 1.2 (ref 0.8–1.2)
Prothrombin Time: 15.1 seconds (ref 11.4–15.2)

## 2018-09-09 MED ORDER — LIDOCAINE-PRILOCAINE 2.5-2.5 % EX CREA
TOPICAL_CREAM | Freq: Once | CUTANEOUS | Status: AC
  Administered 2018-09-09: 18:00:00 via TOPICAL
  Filled 2018-09-09: qty 5

## 2018-09-09 NOTE — Discharge Instructions (Addendum)
Please follow-up with primary care provider, walk-in clinic in 1 week for suture removal.  Please follow-up with PCP to discuss trending low platelets.  Please return to the emergency department for any worsening symptoms or urgent changes in health such as any increasing pain swelling or bleeding.

## 2018-09-09 NOTE — ED Notes (Signed)
Pt given sandwich tray 

## 2018-09-09 NOTE — ED Triage Notes (Signed)
Patient is here because her access would not stop bleeding after dialysis today. Access is on Left arm and wrapped up with no bleeding through dressing noted at this time. A& ox4

## 2018-09-09 NOTE — ED Notes (Signed)
Discharge instructions reviewed with patient. Questions fielded by this RN. Patient verbalizes understanding of instructions. Patient discharged home in stable condition per gaines. No acute distress noted at time of discharge.   No peripheral IV placed this visit.   RN with pt ambulatory to lobby, sig other waiting in parking lot

## 2018-09-09 NOTE — ED Notes (Signed)
Pt finished dialysis a little before 300 pm today per report. Has had trouble stopping bleeding from dialysis access to LUE.  C[place]an unwrapped and not bleeding through gauze that can see.  Did not remove gauze/bandaid over access, will wait for MD to remove.

## 2018-09-09 NOTE — ED Provider Notes (Signed)
[facility] EMERGENCY DEPARTMENT Provider Note   CSN: [id number] Arrival date & time: 09/09/18  1533    History   Chief Complaint Chief Complaint  Patient presents with  . Vascular Access Problem    left arm    HPI Kaitlyn Collier is a 58 y.o. female presents to the emergency department for evaluation of bleeding along the left AC fossa along her vascular access for dialysis.  Patient states that her site of dialysis from 2 days ago has continued to ooze and bleed.  She was sent here by dialysis center for evaluation.  She denies any swelling, numbness, tingling.  Patient currently on Eliquis.  No history of bleeding problems in the past.  She is not having any pain along the elbow.     HPI  Past Medical History:  Diagnosis Date  . Anxiety   . Back pain   . CHF (congestive heart failure) ([place])   . CKD (chronic kidney disease)   . COPD (chronic obstructive pulmonary disease) ([place])   . Depression   . DJD (degenerative joint disease), lumbar   . GERD (gastroesophageal reflux disease)   . Hypertension   . MRSA (methicillin resistant Staphylococcus aureus)   . Obesity   . Osteoarthritis   . Pulmonary edema   . Vitamin B6 deficiency   . Vitamin D deficiency   . VRE (vancomycin-resistant Enterococci)     There are no active problems to display for this patient.   Past Surgical History:  Procedure Laterality Date  . ABDOMINAL HYSTERECTOMY    . ABDOMINAL SURGERY    . COLON SURGERY       OB History   No obstetric history on file.      Home Medications    Prior to Admission medications   Medication Sig Start Date End Date Taking? Authorizing Provider  acetaminophen (TYLENOL) 325 MG tablet Take 650 mg by mouth 2 (two) times daily.    [provider]  albuterol (PROVENTIL HFA;VENTOLIN HFA) 108 (90 Base) MCG/ACT inhaler Inhale 2 puffs into the lungs every 6 (six) hours as needed for wheezing or shortness of breath.    [provider]  apixaban (ELIQUIS) 2.5 MG TABS tablet Take 2.5 mg by mouth 2 (two) times daily.    [provider]  budesonide (PULMICORT) 0.5 MG/2ML nebulizer solution Take 0.5 mg by nebulization 2 (two) times daily.    [provider]  buPROPion (WELLBUTRIN SR) 150 MG 12 hr tablet Take 300 mg by mouth daily.    [provider]  calcitRIOL (ROCALTROL) 0.25 MCG capsule Take 0.25 mcg by mouth daily.    [provider]  cetirizine (ZYRTEC) 10 MG tablet Take 10 mg by mouth daily.    [provider]  citalopram (CELEXA) 40 MG tablet Take 40 mg by mouth daily.    [provider]  cycloSPORINE modified (NEORAL) 25 MG capsule Take 75 mg by mouth 2 (two) times daily.    [provider]  diazepam (VALIUM) 5 MG tablet Take 5 mg by mouth at bedtime as needed for anxiety.    [provider]  fluticasone (FLONASE) 50 MCG/ACT nasal spray Place 1 spray into both nostrils daily.    [provider]  fluticasone furoate-vilanterol (BREO ELLIPTA) 100-25 MCG/INH AEPB Inhale 1 puff into the lungs daily.    [provider]  HYDROmorphone (DILAUDID) 2 MG tablet Take 2 mg by mouth every 6 (six) hours as needed for severe pain.  [provider]  ipratropium-albuterol (DUONEB) 0.5-2.5 (3) MG/3ML SOLN Take 3 mLs by nebulization 3 (three) times daily.    [provider]  losartan (COZAAR) 50 MG tablet Take 50 mg by mouth daily.    [provider]  Multiple Vitamin (MULTIVITAMIN WITH MINERALS) TABS tablet Take 1 tablet by mouth daily.    [provider]  mycophenolate (CELLCEPT) 250 MG capsule Take 500 mg by mouth 2 (two) times daily.    [provider]  omeprazole (PRILOSEC OTC) 20 MG tablet Take 20 mg by mouth daily.    [provider]  predniSONE (DELTASONE) 5 MG tablet Take 5 mg by mouth daily with breakfast.    [provider]  SUMAtriptan (IMITREX) 25 MG tablet Take 50 mg by mouth  every 2 (two) hours as needed for migraine. May repeat in 2 hours if headache persists or recurs.    [provider]  topiramate (TOPAMAX) 100 MG tablet Take 200 mg by mouth daily.    [provider]  verapamil (CALAN) 80 MG tablet Take 80 mg by mouth 2 (two) times daily.    [provider]    Family History History reviewed. No pertinent family history.  Social History Social History   Tobacco Use  . Smoking status: Former Research scientist (life sciences)  . Smokeless tobacco: Never Used  Substance Use Topics  . Alcohol use: No  . Drug use: Yes    Comment: prescribed dilaudid     Allergies   Codeine; Flagyl [metronidazole]; Phenergan [promethazine hcl]; Sulfa antibiotics; and Zosyn [piperacillin sod-tazobactam so]   Review of Systems Review of Systems  Constitutional: Negative for fever.  Gastrointestinal: Negative for nausea and vomiting.  Musculoskeletal: Negative for arthralgias, back pain, joint swelling and myalgias.  Skin: Positive for wound.  Neurological: Negative for numbness.     Physical Exam Updated Vital Signs BP 124/79   Pulse 93   Temp 98.5 F (36.9 C) (Oral)   Resp 16   Ht 5\' 3"  (1.6 m)   Wt 65.8 kg   SpO2 97%   BMI 25.69 kg/m   Physical Exam Constitutional:      Appearance: Normal appearance. She is well-developed and normal weight.  HENT:     Head: Normocephalic and atraumatic.  Eyes:     Conjunctiva/sclera: Conjunctivae normal.  Neck:     Musculoskeletal: Normal range of motion.  Cardiovascular:     Rate and Rhythm: Normal rate.  Pulmonary:     Effort: Pulmonary effort is normal. No respiratory distress.  Musculoskeletal: Normal range of motion.     Comments: Left upper extremity with fistula present fistula appears to be working with palpable vibrations.  Access site from Wednesday appears to be continuing to ooze.  Bleeding is mild.  Skin:    General: Skin is warm.     Findings: No rash.  Neurological:     Mental Status: She is  alert and oriented to person, place, and time.  Psychiatric:        Behavior: Behavior normal.        Thought Content: Thought content normal.      ED Treatments / Results  Labs (all labs ordered are listed, but only abnormal results are displayed) Labs Reviewed  CBC - Abnormal; Notable for the following components:      Result Value   WBC 1.5 (*)    RBC 2.87 (*)    Hemoglobin 8.8 (*)    HCT 28.4 (*)    Platelets 91 (*)  All other components within normal limits  PROTIME-INR    EKG None  Radiology No results found.  Procedures .Marland KitchenLaceration Repair Date/Time: 09/09/2018 6:58 PM Performed by: Duanne Guess, PA-C Authorized by: Duanne Guess, PA-C   Consent:    Consent obtained:  Verbal   Consent given by:  Patient   Alternatives discussed:  No treatment Anesthesia (see MAR for exact dosages):    Anesthesia method:  Topical application   Topical anesthetic:  EMLA cream Laceration details:    Location:  Shoulder/arm   Shoulder/arm location:  L lower arm   Length (cm):  0.5   Depth (mm):  2 Repair type:    Repair type:  Simple Pre-procedure details:    Preparation:  Patient was prepped and draped in usual sterile fashion Treatment:    Area cleansed with:  Betadine   Amount of cleaning:  Standard Skin repair:    Repair method:  Sutures   Suture size:  5-0   Suture material:  Nylon   Number of sutures:  1 Post-procedure details:    Dressing:  Non-adherent dressing   Patient tolerance of procedure:  Tolerated well, no immediate complications   (including critical care time)  Medications Ordered in ED Medications  lidocaine-prilocaine (EMLA) cream ( Topical Given 09/09/18 1807)     Initial Impression / Assessment and Plan / ED Course  I have reviewed the triage vital signs and the nursing notes.  Pertinent labs & imaging results that were available during my care of the patient were reviewed by me and considered in my medical decision making (see  chart for details).        58 year old female with bleeding from her dialysis site from dialysis 2 days ago.  Site has been oozing blood.  She was sent by dialysis for evaluation.  Patient noted to have trending low platelets.  INR and PTT normal.  Patient given 1 stitch along the bleeding site which resolved the bleeding.  Patient will follow-up with PCP to discuss trending low platelets.  Final Clinical Impressions(s) / ED Diagnoses   Final diagnoses:  Problem with dialysis access, initial encounter [facility])  Bleeding    ED Discharge Orders    None       Renata Caprice 09/09/18 1921    Nena Polio, MD 09/09/18 2330

## 2018-09-09 NOTE — ED Triage Notes (Signed)
Arrives from dialysis with spouse.  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  Patient was dialyzed today and sent to ED for evaluation of shunt due to not being able stop the bleeding.  Patient is AAOx3.  Skin warm and dry. NAD

## 2018-11-29 IMAGING — DX DG HAND COMPLETE 3+V*R*
3 series · 3 of 3 positions shown · non-contrast
Comparison: None.

CLINICAL DATA: Recent fall with hand pain, initial encounter

EXAM:
RIGHT HAND - COMPLETE 3+ VIEW

[hand ap]
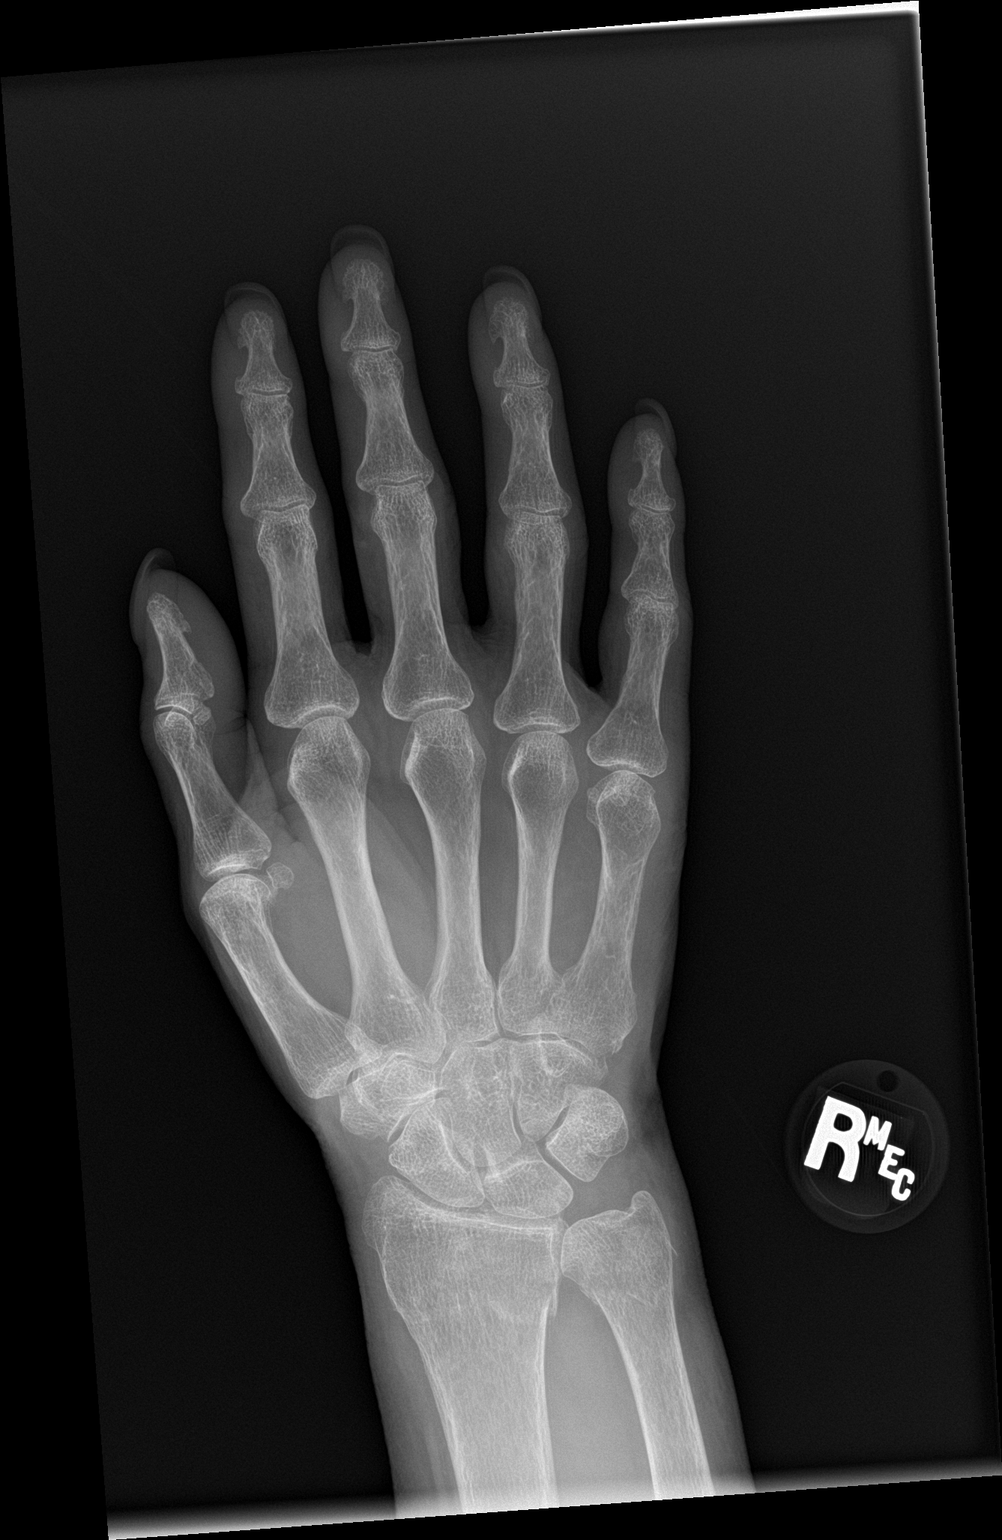

[hand obl]
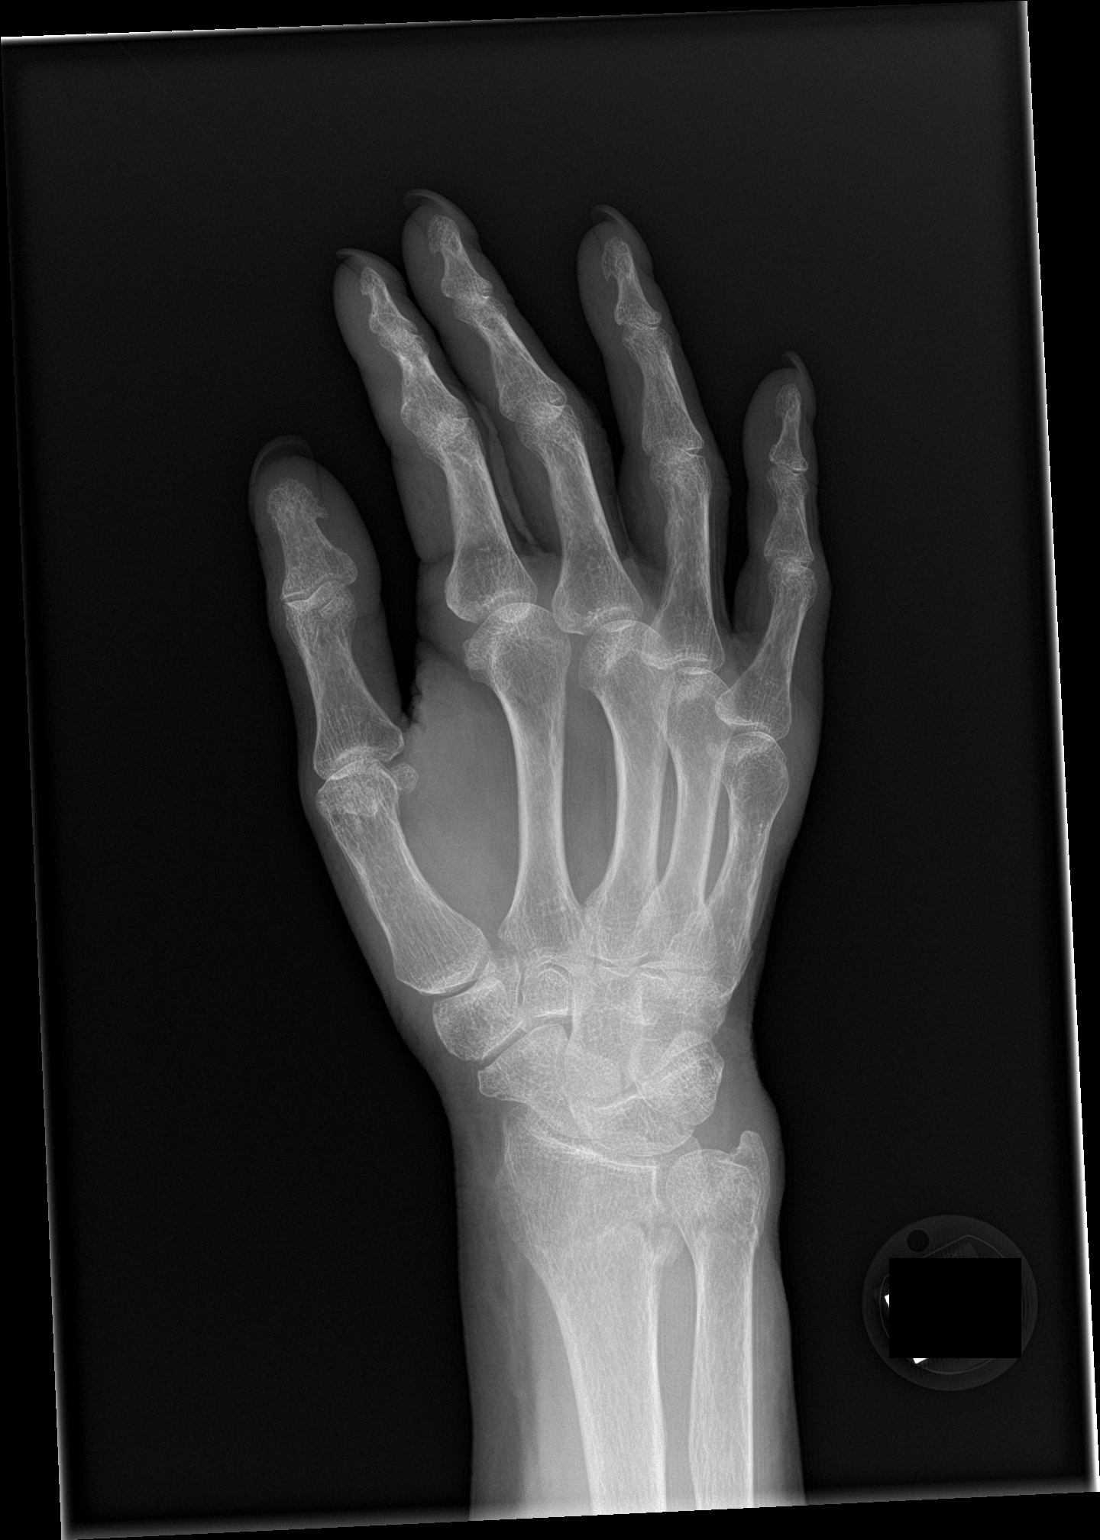

[hand lat]
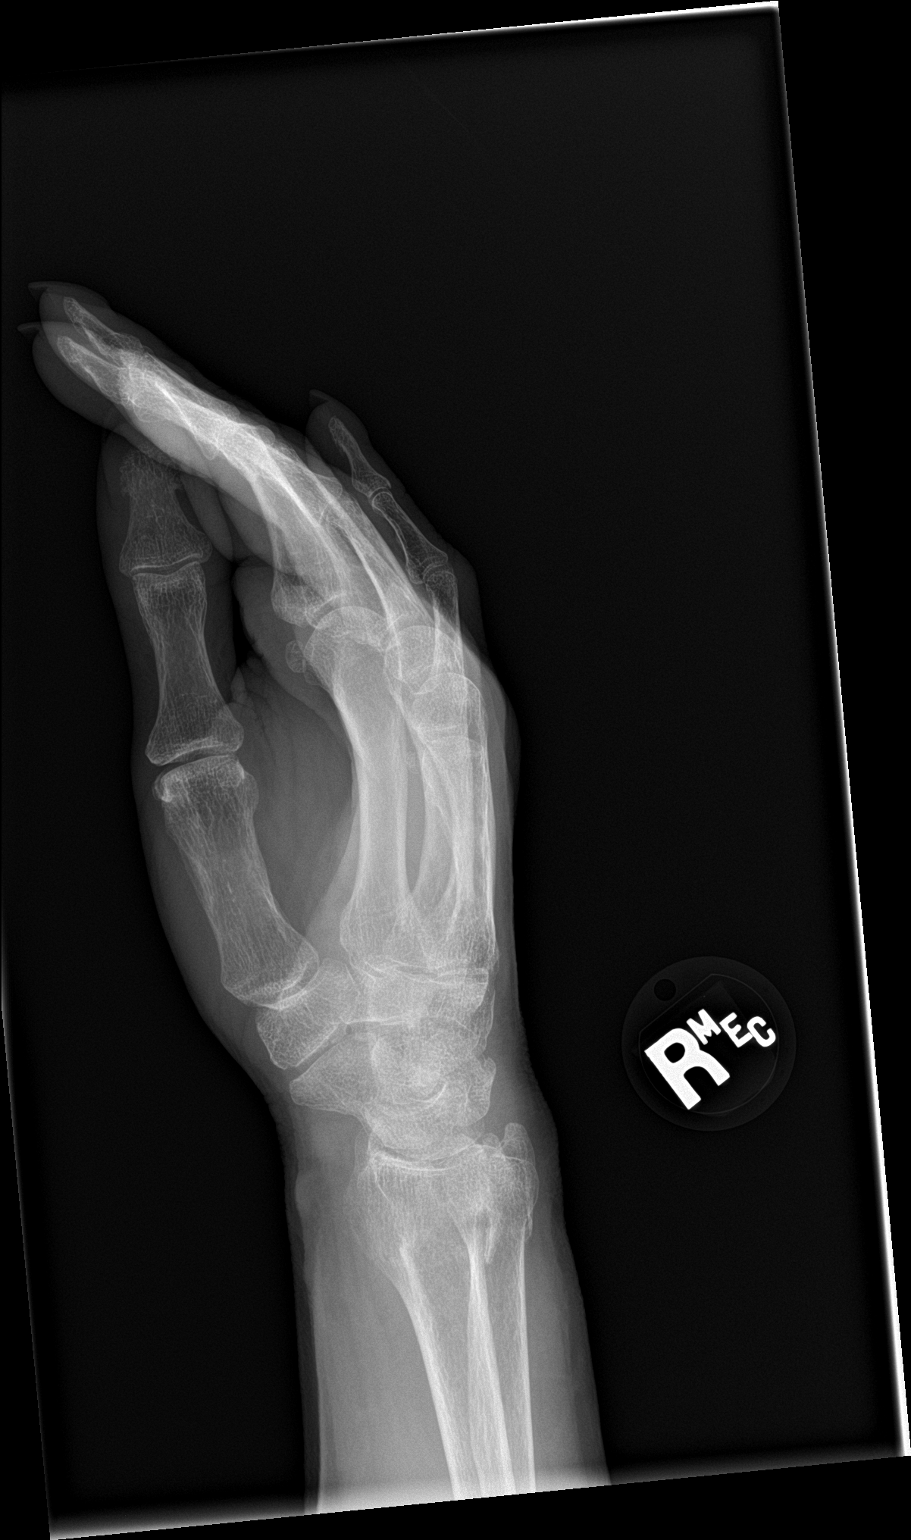

[3 of 3 positions shown; findings below may reference images not displayed]

FINDINGS: There are mildly impacted fractures of the distal radial and ulnar
metastases. No significant displacement is noted aside from the
impaction. No other fractures are seen.
IMPRESSION: Distal radial and ulnar fractures with mild impaction.

## 2019-04-21 DEATH — deceased
